# Patient Record
Sex: Female | Born: 2000 | Race: Black or African American | Hispanic: No | Marital: Single
Health system: Midwestern US, Academic
[De-identification: ages and names within clinical notes are randomized; demographics above are authoritative.]

---
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
## 2018-07-16 ENCOUNTER — Emergency Department (HOSPITAL_COMMUNITY): Payer: Self-pay

## 2018-07-16 ENCOUNTER — Encounter (HOSPITAL_COMMUNITY): Payer: Self-pay

## 2018-07-16 ENCOUNTER — Other Ambulatory Visit: Payer: Self-pay

## 2018-07-16 ENCOUNTER — Emergency Department (HOSPITAL_COMMUNITY)
Admission: EM | Admit: 2018-07-16 | Discharge: 2018-07-16 | Disposition: A | Discharge status: Home / Self Care | Payer: Self-pay | Attending: Emergency Medicine | Admitting: Emergency Medicine

## 2018-07-16 DIAGNOSIS — S93401A Sprain of unspecified ligament of right ankle, initial encounter: Secondary | ICD-10-CM | POA: Insufficient documentation

## 2018-07-16 DIAGNOSIS — Y92512 Supermarket, store or market as the place of occurrence of the external cause: Secondary | ICD-10-CM | POA: Insufficient documentation

## 2018-07-16 DIAGNOSIS — Y999 Unspecified external cause status: Secondary | ICD-10-CM | POA: Insufficient documentation

## 2018-07-16 DIAGNOSIS — W231XXA Caught, crushed, jammed, or pinched between stationary objects, initial encounter: Secondary | ICD-10-CM | POA: Insufficient documentation

## 2018-07-16 DIAGNOSIS — Z79899 Other long term (current) drug therapy: Secondary | ICD-10-CM | POA: Insufficient documentation

## 2018-07-16 DIAGNOSIS — Y9389 Activity, other specified: Secondary | ICD-10-CM | POA: Insufficient documentation

## 2018-07-16 NOTE — ED Triage Notes (Signed)
Patient reports that her right foot got caught between a grocery cart and a chair that she was sitting in today. Patient c/o pain right ankle

## 2018-07-16 NOTE — ED Notes (Signed)
Ortho tech called to patient room for ASO ankle placement and crutches. Patient reports she is 38ft 7in tall.

## 2018-07-16 NOTE — ED Provider Notes (Signed)
[facility]   CSN: [id number] Arrival date & time: 07/16/18  1652    History   Chief Complaint Chief Complaint  Patient presents with  . Ankle Injury    HPI Abigail Harper is a 18 y.o. female who is previously healthy who presents with right ankle pain after hitting it wedged between a grocery cart and an aisle.  She reports she was sitting at the blood pressure cuff at [facility] when she went to get up and her foot caught between a grocery cart.  She denies any foot pain.  She reports she initially did not have any pain, but walking on it, the pain became worse.  She had associated swelling.  She has been elevating it.  She denies any medication prior to arrival.  She denies any other injuries.  She denies any numbness or tingling.     HPI  History reviewed. No pertinent past medical history.  There are no active problems to display for this patient.   History reviewed. No pertinent surgical history.   OB History   No obstetric history on file.      Home Medications    Prior to Admission medications   Medication Sig Start Date End Date Taking? Authorizing Provider  naproxen sodium (ANAPROX) 220 MG tablet Take 2 tablets (440 mg total) by mouth 2 (two) times daily as needed (pain). 01/23/16   Fayrene Helper, PA-C  polyethylene glycol (MIRALAX / GLYCOLAX) packet Take 17 g by mouth daily. 01/23/16   Fayrene Helper, PA-C    Family History History reviewed. No pertinent family history.  Social History Social History   Tobacco Use  . Smoking status: Never Smoker  . Smokeless tobacco: Never Used  Substance Use Topics  . Alcohol use: No  . Drug use: No     Allergies   Patient has no known allergies.   Review of Systems Review of Systems  Musculoskeletal: Positive for arthralgias and joint swelling.  Neurological: Negative for numbness.     Physical Exam Updated Vital Signs BP (!) 135/91 (BP Location: Left Arm)   Pulse  72   Temp (!) 97.5 F (36.4 C) (Oral)   Resp 16   Ht 5\' 7"  (1.702 m)   Wt 88 kg   LMP 07/05/2018 (Approximate)   SpO2 98%   BMI 30.38 kg/m   Physical Exam Vitals signs and nursing note reviewed.  Constitutional:      General: She is not in acute distress.    Appearance: She is well-developed. She is not diaphoretic.  HENT:     Head: Normocephalic and atraumatic.     Mouth/Throat:     Pharynx: No oropharyngeal exudate.  Eyes:     General: No scleral icterus.       Right eye: No discharge.        Left eye: No discharge.     Conjunctiva/sclera: Conjunctivae normal.     Pupils: Pupils are equal, round, and reactive to light.  Neck:     Musculoskeletal: Normal range of motion and neck supple.     Thyroid: No thyromegaly.  Cardiovascular:     Rate and Rhythm: Normal rate and regular rhythm.     Heart sounds: Normal heart sounds. No murmur. No friction rub. No gallop.   Pulmonary:     Effort: Pulmonary effort is normal. No respiratory distress.     Breath sounds: Normal breath sounds. No stridor. No wheezing or rales.  Musculoskeletal:  Comments: Right sided lateral ankle tenderness and edema, no medial or lateral malleolar tenderness, Achilles intact, no deficit, no bony tenderness of the foot, DP pulse intact, sensation intact to the foot  Lymphadenopathy:     Cervical: No cervical adenopathy.  Skin:    General: Skin is warm and dry.     Coloration: Skin is not pale.     Findings: No rash.  Neurological:     Mental Status: She is alert.     Coordination: Coordination normal.      ED Treatments / Results  Labs (all labs ordered are listed, but only abnormal results are displayed) Labs Reviewed - No data to display  EKG None  Radiology Dg Ankle Complete Right  Result Date: 07/16/2018 CLINICAL DATA:  18 year old female status post blunt trauma to right foot and ankle by grocery cart today. Pain. EXAM: RIGHT ANKLE - COMPLETE 3+ VIEW COMPARISON:  None. FINDINGS:  Bone mineralization is within normal limits. There is no evidence of fracture, dislocation, or joint effusion. No acute osseous abnormality identified. No discrete soft tissue injury. IMPRESSION: No acute fracture or dislocation identified about the right ankle. Electronically Signed   By: Odessa Fleming M.D.   On: 07/16/2018 18:01    Procedures Procedures (including critical care time)  Medications Ordered in ED Medications - No data to display   Initial Impression / Assessment and Plan / ED Course  I have reviewed the triage vital signs and the nursing notes.  Pertinent labs & imaging results that were available during my care of the patient were reviewed by me and considered in my medical decision making (see chart for details).        Patient X-Ray negative for obvious fracture or dislocation. Home Care discussed including rest and elevate the injured ankle, apply ice intermittently. Use crutches without weight bearing until able to comfortable bear partial weight, then progress to full weight bearing as tolerated.  ASO splint applied. Crutches given. See ortho prn.  Patient understands and agrees with plan.  Patient vitals stable through ED course and discharged in satisfactory condition.    Final Clinical Impressions(s) / ED Diagnoses   Final diagnoses:  Sprain of right ankle, unspecified ligament, initial encounter    ED Discharge Orders    None       Emi Holes, PA-C 07/16/18 1830    Virgina Norfolk, DO 07/17/18 [phone]

## 2018-07-16 NOTE — Discharge Instructions (Addendum)
Medications: Ibuprofen, Tylenol ° °Treatment: Take ibuprofen every 8 hours to help with pain and swelling. You can alternate with Tylenol for breakthrough pain 4 hours after taking ibuprofen. Use ice 3-4 times daily alternating 20 minutes on, 20 minutes off. Keep your leg elevated whenever you're not walking on it. Wear your splint at all times except when bathing. Use crutches at all times initially and begin partial weightbearing as tolerated. ° °Follow-up: Please follow-up with the orthopedic doctor if your symptoms are not improving over the next 7-10 days. Please return to the emergency department if you develop any new or worsening symptoms. ° ° °

## 2019-03-19 ENCOUNTER — Other Ambulatory Visit: Payer: Self-pay

## 2019-03-19 ENCOUNTER — Encounter (HOSPITAL_COMMUNITY): Payer: Self-pay | Admitting: Emergency Medicine

## 2019-03-19 ENCOUNTER — Emergency Department (HOSPITAL_COMMUNITY)
Admission: EM | Admit: 2019-03-19 | Discharge: 2019-03-19 | Disposition: A | Discharge status: Home / Self Care | Payer: Self-pay | Attending: Emergency Medicine | Admitting: Emergency Medicine

## 2019-03-19 ENCOUNTER — Emergency Department (HOSPITAL_COMMUNITY): Payer: Self-pay

## 2019-03-19 DIAGNOSIS — Y990 Civilian activity done for income or pay: Secondary | ICD-10-CM | POA: Insufficient documentation

## 2019-03-19 DIAGNOSIS — X500XXA Overexertion from strenuous movement or load, initial encounter: Secondary | ICD-10-CM | POA: Insufficient documentation

## 2019-03-19 DIAGNOSIS — S93402A Sprain of unspecified ligament of left ankle, initial encounter: Secondary | ICD-10-CM | POA: Insufficient documentation

## 2019-03-19 DIAGNOSIS — Y9289 Other specified places as the place of occurrence of the external cause: Secondary | ICD-10-CM | POA: Insufficient documentation

## 2019-03-19 DIAGNOSIS — Y9389 Activity, other specified: Secondary | ICD-10-CM | POA: Insufficient documentation

## 2019-03-19 MED ORDER — IBUPROFEN 800 MG PO TABS: 800.0000 mg | ORAL_TABLET | Freq: Three times a day (TID) | ORAL | 0 refills | Status: AC | PRN

## 2019-03-19 NOTE — ED Provider Notes (Signed)
[facility] EMERGENCY DEPARTMENT Provider Note   CSN: [id number] Arrival date & time: 03/19/19  2355     History   Chief Complaint Chief Complaint  Patient presents with  . Ankle Pain    HPI Abigail Harper is a 18 y.o. female.     HPI Patient presents to the emergency department with a left ankle injury that occurred while at work yesterday.  The patient states that she was coming down off a ladder when she stepped wrong on one of the ladder steps and twisted her ankle.  She states when she got down off the ladder she felt like her ankle gave way as well.  Patient states that certain movements palpation make the pain worse.  Patient states she applied an ASO brace to her ankle.  Patient states she did not take any medications prior to arrival for her symptoms.  Patient denies any other injuries. History reviewed. No pertinent past medical history.  There are no active problems to display for this patient.   History reviewed. No pertinent surgical history.   OB History   No obstetric history on file.      Home Medications    Prior to Admission medications   Medication Sig Start Date End Date Taking? Authorizing Provider  naproxen sodium (ANAPROX) 220 MG tablet Take 2 tablets (440 mg total) by mouth 2 (two) times daily as needed (pain). 01/23/16   Fayrene Helper, PA-C  polyethylene glycol (MIRALAX / GLYCOLAX) packet Take 17 g by mouth daily. 01/23/16   Fayrene Helper, PA-C    Family History No family history on file.  Social History Social History   Tobacco Use  . Smoking status: Never Smoker  . Smokeless tobacco: Never Used  Substance Use Topics  . Alcohol use: No  . Drug use: No     Allergies   Patient has no known allergies.   Review of Systems Review of Systems All other systems negative except as documented in the HPI. All pertinent positives and negatives as reviewed in the HPI.  Physical Exam Updated Vital Signs BP 107/73 (BP Location:  Right Arm)   Pulse 95   Temp 98.3 F (36.8 C) (Oral)   Resp 16   LMP 03/12/2019   Physical Exam Vitals signs and nursing note reviewed.  Constitutional:      General: She is not in acute distress.    Appearance: She is well-developed.  HENT:     Head: Normocephalic and atraumatic.  Eyes:     Pupils: Pupils are equal, round, and reactive to light.  Pulmonary:     Effort: Pulmonary effort is normal.  Musculoskeletal:     Left ankle: She exhibits decreased range of motion. She exhibits no swelling, no ecchymosis, no deformity and normal pulse. Tenderness. Medial malleolus tenderness found. No proximal fibula tenderness found. Achilles tendon normal.  Skin:    General: Skin is warm and dry.  Neurological:     Mental Status: She is alert and oriented to person, place, and time.      ED Treatments / Results  Labs (all labs ordered are listed, but only abnormal results are displayed) Labs Reviewed - No data to display  EKG None  Radiology No results found.  Procedures Procedures (including critical care time)  Medications Ordered in ED Medications - No data to display   Initial Impression / Assessment and Plan / ED Course  I have reviewed the triage vital signs and the nursing notes.  Pertinent  labs & imaging results that were available during my care of the patient were reviewed by me and considered in my medical decision making (see chart for details).       Patient be treated for sprain of her right ankle.  Will give crutches to help take some of the pressure off the ankle.  She does have an ASO which I feel is appropriate for this injury.  Told to ice and elevate her ankle.  Patient agrees to the plan and all questions were answered.  Final Clinical Impressions(s) / ED Diagnoses   Final diagnoses:  None    ED Discharge Orders    None       Dalia Heading, PA-C 03/19/19 [place]    Noemi Chapel, MD 03/20/19 [phone]

## 2019-03-19 NOTE — Discharge Instructions (Addendum)
Return here as needed.  Your ankle x-rays did not show any broken bones.  Ice and elevate.  I would continue to wear the brace.  Use the crutches as needed.  Follow-up with the orthopedist as needed.

## 2019-03-19 NOTE — ED Notes (Signed)
Family at bedside. 

## 2019-03-19 NOTE — ED Notes (Signed)
Patient verbalizes understanding of discharge instructions. Opportunity for questioning and answers were provided. Armband removed by staff, pt discharged from ED.  

## 2019-03-19 NOTE — ED Triage Notes (Signed)
C/o L ankle pain.  States she twisted it when coming down 3-4 steps off a ladder at work yesterday morning.  ASO in place on arrival.

## 2019-03-24 ENCOUNTER — Other Ambulatory Visit: Payer: Self-pay

## 2019-03-24 DIAGNOSIS — Z20822 Contact with and (suspected) exposure to covid-19: Secondary | ICD-10-CM

## 2019-03-26 LAB — NOVEL CORONAVIRUS, NAA: SARS-CoV-2, NAA: NOT DETECTED

## 2021-07-11 IMAGING — CR DG ANKLE COMPLETE 3+V*L*
3 series · 3 of 3 positions shown · non-contrast
Comparison: None.

CLINICAL DATA: Anterior and medial left ankle pain following an
injury 2 days ago.

EXAM:
LEFT ANKLE COMPLETE - 3+ VIEW

[ankle ap]
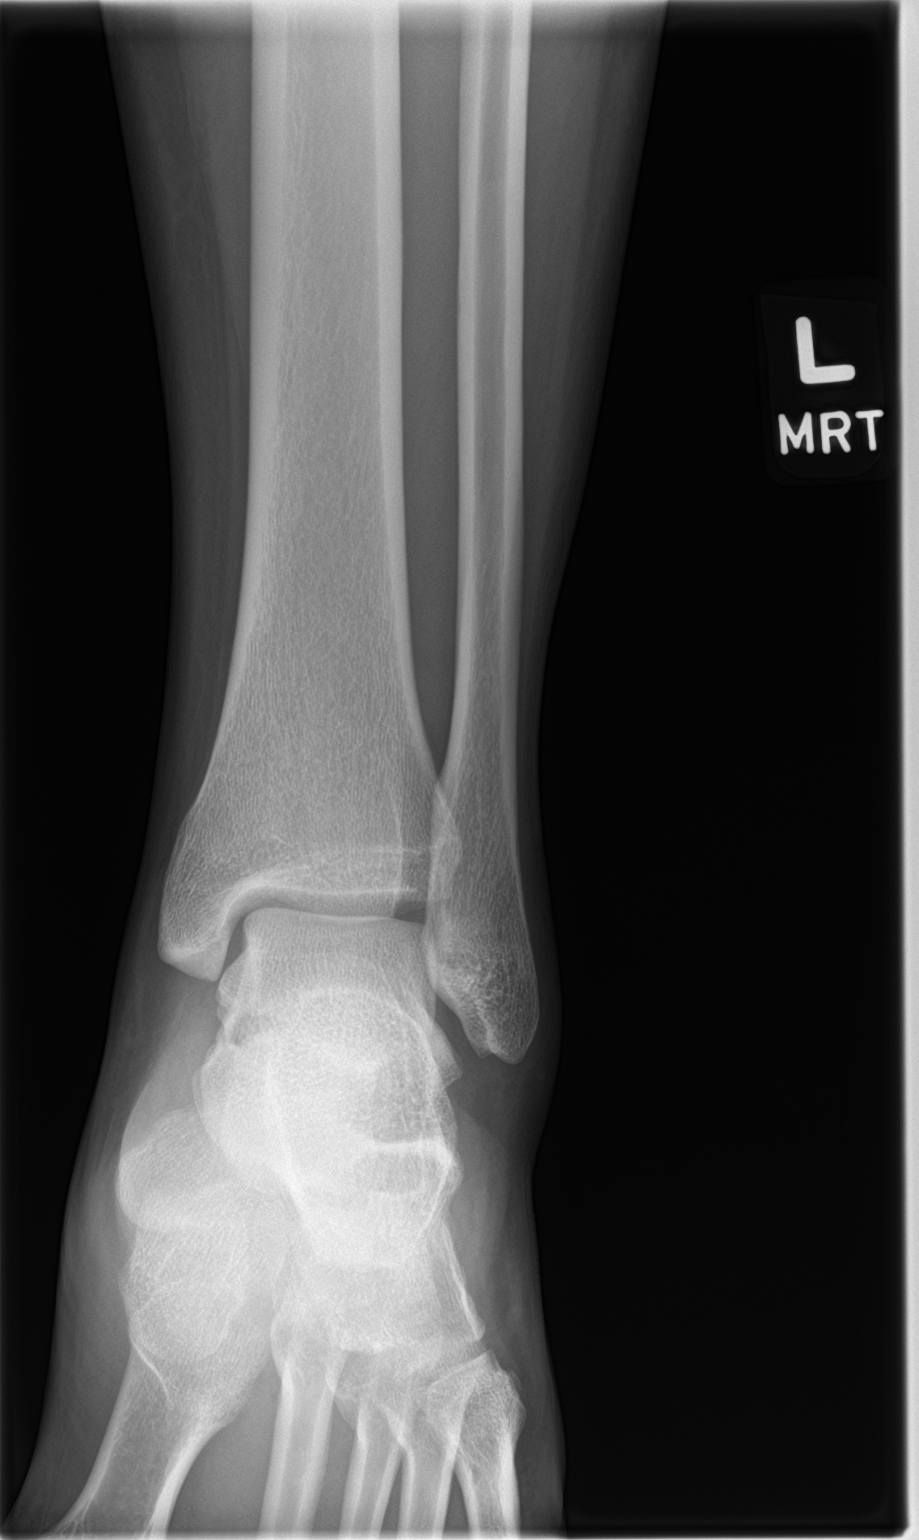

[ankle obl]
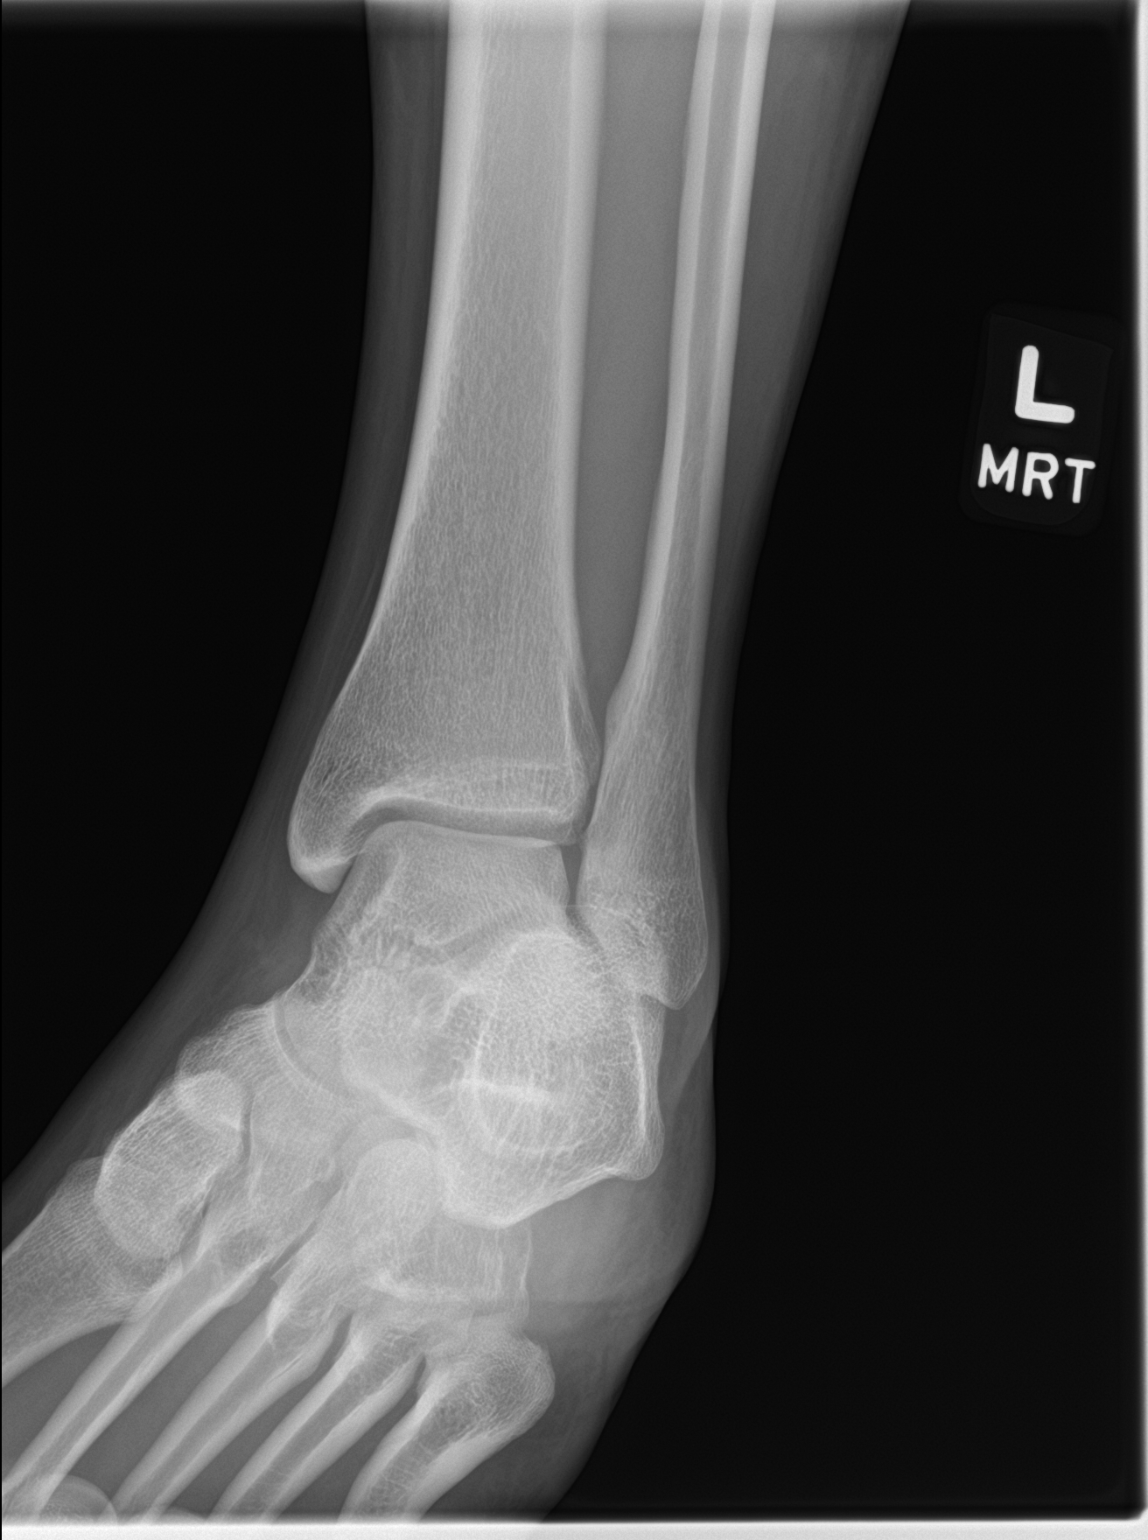

[ankle lat]
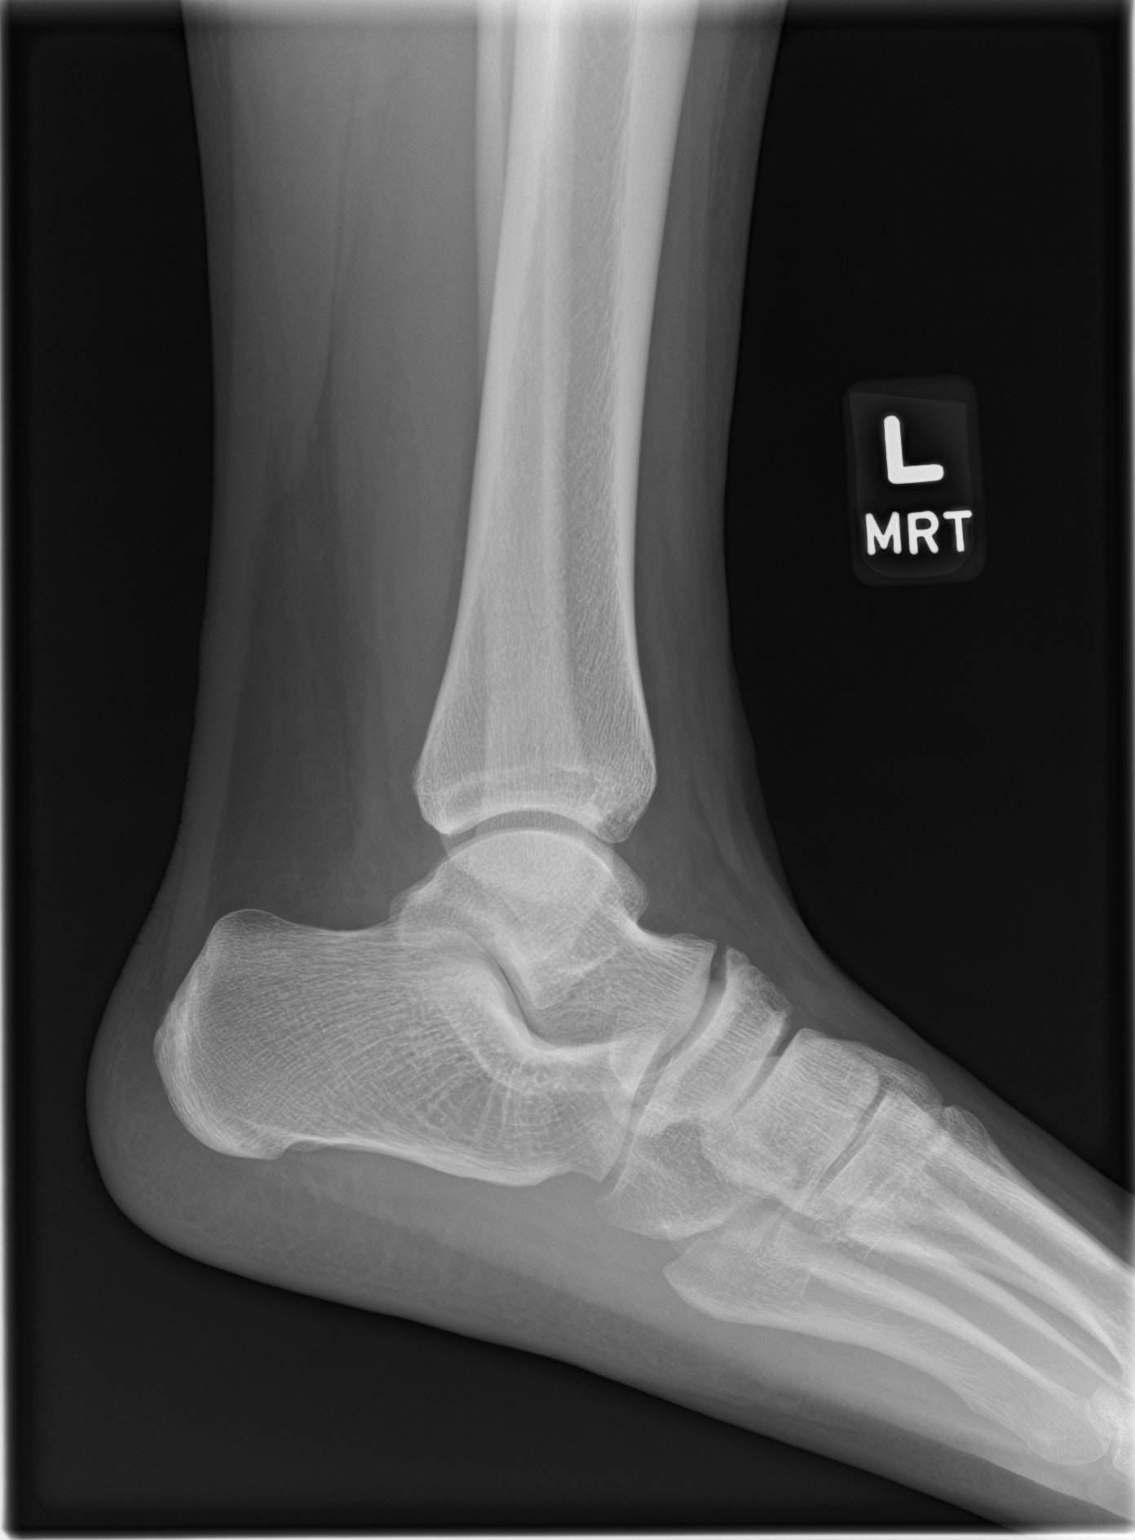

[3 of 3 positions shown; findings below may reference images not displayed]

FINDINGS: Mild soft tissue swelling. No fracture, dislocation or effusion
seen.
IMPRESSION: No fracture.

## 2022-02-14 ENCOUNTER — Inpatient Hospital Stay
Admit: 2022-02-14 | Discharge: 2022-02-14 | Disposition: A | Discharge status: Home / Self Care | Payer: PRIVATE HEALTH INSURANCE

## 2022-02-14 DIAGNOSIS — R519 Headache, unspecified: Secondary | ICD-10-CM

## 2022-02-14 MED ORDER — proCHLORPERazine (COMPAZINE) injection 10 mg
10 | Freq: Once | INTRAMUSCULAR | Status: AC
Start: 2022-02-14 — End: 2022-02-14
  Administered 2022-02-14: 16:00:00 10 mg via INTRAVENOUS

## 2022-02-14 MED ORDER — sodium chloride 0.9 % 1,000 mL IV fluid
Freq: Once | INTRAVENOUS | Status: AC
Start: 2022-02-14 — End: 2022-02-14
  Administered 2022-02-14: 16:00:00 1000 mL via INTRAVENOUS

## 2022-02-14 MED ORDER — ketorolac (TORADOL) injection 15 mg
15 | Freq: Once | INTRAMUSCULAR | Status: AC
Start: 2022-02-14 — End: 2022-02-14
  Administered 2022-02-14: 16:00:00 15 mg via INTRAVENOUS

## 2022-02-14 MED FILL — PROCHLORPERAZINE EDISYLATE 10 MG/2 ML (5 MG/ML) INJECTION SOLUTION: 10 10 mg/2 mL (5 mg/mL) | INTRAMUSCULAR | Qty: 2

## 2022-02-14 MED FILL — KETOROLAC 15 MG/ML INJECTION SOLUTION: 15 15 mg/mL | INTRAMUSCULAR | Qty: 1

## 2022-02-14 NOTE — Unmapped (Signed)
ED Attending Attestation Note    Date of service:  02/14/2022    This patient was seen by the resident physician.  I have seen and examined the patient, agree with the workup, evaluation, management and diagnosis. The care plan has been discussed and I concur.     My assessment reveals a 21 y.o. female with history of migraines who has a third shift job. SHe has had poor sleep hygiene and use multiple energy drinks over the course of the last 3 days. Now with recurrent migraine, left sided with visual scotoma, and insomnia. One episode of emesis, but now eating normally. Nonfocal neurologic exam, no visual complaints at this time.  Pain has moderated and scotoma resolved.

## 2022-02-14 NOTE — Unmapped (Signed)
Was seen today due to a headache. Please be sure to stay well-hydrated over the next couple of days. Make sure that you get an adequate amount of sleep before work. Try to avoid using caffeine at least 6 hours before you are planning to sleep.

## 2022-02-14 NOTE — Unmapped (Signed)
Visitor Print production planner (VSA) Customer service manager (SW) that the patient has been discharged and needs a ride home.  SW located the patient in the lobby.  The patient verbalized her plan to pay for an [employer] in lieu of waiting for an insurance cab.  The patient does not have any SW needs at this time.    Jerilynn Birkenhead MSW, [facility] Social Worker  [phone]

## 2022-02-14 NOTE — Unmapped (Signed)
Patient presents to [facility] with c/o migraine and sleep issues after drinking multiple energy drinks and working night shift. Pt is alert and oriented at this time, no acute distress noted.

## 2022-02-14 NOTE — Unmapped (Signed)
[facility] ED Note    Date of Service: 02/14/2022  Reason for Visit: Headache      ED Course and MDM     Regana Kemple is a 21 y.o. female with a history and presentation as described below in HPI.  The patient was evaluated by myself and the ED Attending Physician. All management and disposition plans were discussed and agreed upon.    ED Course as of 02/14/22 1208   Fri Feb 14, 2022   1050 Patient is a 21 y.o. female who presents for headaches. Upon presentation, patient was afebrile, not tachycardic, satting well on RA. On examination, patient was in no acute distress, well appearing, breathing spontaneously. Her neuro examination was nonfocal, patient did not have any nystagmus, no neck pain and had full range of motion, denies any visual changes. Low concern for CVST, SAH, or other intracranial pathology given reassuring examination. Low concern for infectious cause, patient is afebrile without other systemic signs or symptoms. More likely headache due to her heavy caffeine use as well as decreased sleep. We will give patient fluids, Toradol and Compazine for symptomatic treatment. We will also get EKG due to her heavy caffeine use to rule out any signs of dysrhythmia.     1127 EKG NSR without signs of acute ischemia, no dysrhythmia.   1207 Upon reassessment, patient reports symptomatic improvement. Overall reassuring examination. Patient will be discharged home, advised to continue with good fluid intake and adequate sleep before work.       Medical Decision Making  Problems Addressed:  Nonintractable headache, unspecified chronicity pattern, unspecified headache type: complicated acute illness or injury    Amount and/or Complexity of Data Reviewed  Labs: ordered.  ECG/medicine tests: ordered.    Risk  Prescription drug management.         Medications received during this ED visit:  Medications   sodium chloride 0.9 % 1,000 mL IV fluid (1,000 mLs  Intravenous New Bag 02/14/22 1133)   ketorolac (TORADOL) injection 15 mg (15 mg Intravenous Given 02/14/22 1134)   proCHLORPERazine (COMPAZINE) injection 10 mg (10 mg Intravenous Given 02/14/22 1134)       Discharge - At this time, the patient was deemed appropriate for discharge. My customary discharge instructions, including strict return precautions for new or worsening symptoms concerning to the patient, were provided. All of the patient's questions were answered satisfactorily, and they were subsequently sent home in stable condition.     Impression     1. Nonintractable headache, unspecified chronicity pattern, unspecified headache type         Plan     Discharge  1. The patient is to be discharged home in stable/improved condition.  2. Workup, treatment and diagnosis were discussed with the patient and/or family members; the patient agrees to the plan and all questions were addressed and answered.  3. The patient is instructed to return to the emergency department should her symptoms worsen or any concern she believes warrants acute physician evaluation.    HPI     Ladeana Chrisann Melaragno is a 21 y.o. female who presents with headache. Patient reports that she is presenting with a migraine after she had multiple energy drinks and worked an overnight shift. Patient reports that Wednesday overnight, she had a 5-hour energy drink right before she had gotten off work. Patient was unable to sleep throughout the rest of the day due to the caffeine. Patient reports that she finally fell asleep around 8 PM, but  had to wake up at 10 PM for work the next day. Had trouble staying up, therefore drink some red bull during the shift. During the shift, started feeling a headache come on that was gradual. Reports that it is located all over, but mostly behind the left eye. Reports photophobia. Denies any photosensitivity. Has never really had migraines in the past. Denies any history of clots or bleeding disorders. No falls or head  trauma. Denies any nausea or vomiting. Denies any medical history. Not the worst headache that she has ever had. Denies any chest pain, shortness of breath, abdominal pain, dysuria.    Other than stated above, no additional associated symptoms or aggravating or alleviating factors are noted.    History     History reviewed. No pertinent past medical history.    History reviewed. No pertinent surgical history.    Patient  reports that she has been smoking cigarettes. She uses smokeless tobacco. She reports current alcohol use. She reports current drug use. Drug: Marijuana.    Previous Medications    No medications on file       Allergies as of 02/14/2022    (No Known Allergies)       All nursing notes and triage notes were appropriately reviewed in the course of the creation of this note.     Review of Systems     ROS as stated above, all other systems reviewed and are negative.    Physical Exam     Vitals:    02/14/22 1036   BP: 122/77   BP Location: Left upper arm   Patient Position: Sitting   Pulse: 72   Resp: 17   Temp: 98.2 F (36.8 C)   TempSrc: Oral   SpO2: 98%       Physical Exam  Constitutional:       Appearance: Normal appearance.   HENT:      Head: Normocephalic and atraumatic.      Nose: Nose normal.      Mouth/Throat:      Pharynx: Oropharynx is clear.   Eyes:      Pupils: Pupils are equal, round, and reactive to light.   Cardiovascular:      Rate and Rhythm: Normal rate and regular rhythm.      Pulses: Normal pulses.      Heart sounds: Normal heart sounds.   Pulmonary:      Effort: Pulmonary effort is normal.      Breath sounds: Normal breath sounds.   Abdominal:      Palpations: Abdomen is soft.      Tenderness: There is no abdominal tenderness.   Musculoskeletal:         General: Normal range of motion.      Cervical back: Neck supple.   Skin:     General: Skin is warm.      Capillary Refill: Capillary refill takes less than 2 seconds.   Neurological:      General: No focal deficit present.       Mental Status: She is alert.       Diagnostic Studies     Labs:  Labs Reviewed   ED ECG 12-LEAD (MUSE)    Narrative:     Ventricular Rate:  76  BPM  Atrial Rate:  76  BPM  P-R Interval:  130  ms  QRS Duration:  80  ms  QT:  378  ms  QTc:  425  ms  P Axis:  42  degrees  R Axis:  33  degrees  T Axis:  52  degrees  Diagnosis Line:  NORMAL SINUS RHYTHM WITH SINUS ARRHYTHMIA ^ NORMAL ECG       Radiology:  No orders to display       Procedures     Procedures  ______________________________________________________________________________  Duanne Moron, MD  PGY-2  [facility]     This note was dictated using voice-recognition software, which occasionally leads to inadvertent typographic errors.       Duanne Moron, MD  Resident  02/14/22 [phone]

## 2022-04-21 ENCOUNTER — Inpatient Hospital Stay
Admit: 2022-04-21 | Discharge: 2022-04-22 | Disposition: A | Discharge status: Home / Self Care | Payer: PRIVATE HEALTH INSURANCE

## 2022-04-21 DIAGNOSIS — S39012A Strain of muscle, fascia and tendon of lower back, initial encounter: Secondary | ICD-10-CM

## 2022-04-21 MED ORDER — methocarbamoL (ROBAXIN) tablet 750 mg
500 | Freq: Once | ORAL | Status: AC
Start: 2022-04-21 — End: 2022-04-21
  Administered 2022-04-22: 01:00:00 750 mg via ORAL

## 2022-04-21 MED ORDER — ibuprofen (MOTRIN) tablet 800 mg
800 | Freq: Once | ORAL | Status: AC
Start: 2022-04-21 — End: 2022-04-21
  Administered 2022-04-22: 01:00:00 800 mg via ORAL

## 2022-04-21 MED ORDER — oxyCODONE (ROXICODONE) immediate release tablet 5 mg
5 | Freq: Once | ORAL | Status: AC
Start: 2022-04-21 — End: 2022-04-21
  Administered 2022-04-22: 01:00:00 5 mg via ORAL

## 2022-04-21 MED ORDER — lidocaine (LIDODERM) 5 % 1 patch
5 | Freq: Once | TOPICAL
Start: 2022-04-21 — End: 2022-04-22
  Administered 2022-04-22: 01:00:00 1 via TRANSDERMAL

## 2022-04-21 NOTE — Unmapped (Cosign Needed)
Pt arrived into the ED with lower back pain , patient rated pain was  9 out of 10. Patient stated the pain started a month ago. Patient stated the pain happened when she lifted a heavy box at work.

## 2022-04-21 NOTE — Unmapped (Signed)
AVS reviewed with patient. Patient demonstrates understanding of discharge education. PIV removed per protocol. Patient ambulatory and able to exit ER under own power.

## 2022-04-21 NOTE — Unmapped (Signed)
ED Attending Attestation Note    Date of service:  04/21/2022    This patient was seen by the resident physician.  I have seen and examined the patient, agree with the workup, evaluation, management and diagnosis. The care plan has been discussed and I concur.     My assessment reveals a 21 y.o. female presenting with back pain..    On exam, patient has 5/5 strength in bilateral lower extremities.  She is ambulatory with a normal gait.  She has no bony tenderness to palpation.  She has a normal rate and regular rhythm.  She is easy work of breathing with equal chest rise bilaterally.

## 2022-04-21 NOTE — Unmapped (Signed)
[facility] ED Note    Date of Service: 04/21/2022  Reason for Visit: Back Pain      Patient History     HPI  Linda Weaver is a 21 y.o. female who presents emergency department for back pain.  Patient states back pain began 1 month ago when she was lifting a box at work, states that she strained her back then.  Patient states that she has seen doctors for this, has been prescribed Robaxin, and ibuprofen without relief of her symptoms.  Patient states that today her pain continues to be a bother, and is now rating to her right hip.  She denies any trauma to the right hip.  She further denies any saddle anesthesia, urinary retention and lower extremity weakness.      History reviewed. No pertinent past medical history.  History reviewed. No pertinent surgical history.      Physical Exam     Vitals:    04/21/22 1836 04/21/22 1914   BP: 119/70 128/75   BP Location: Left upper arm Right upper arm   Patient Position: Sitting Lying   BP Cuff Size:  Regular   Pulse: 92 83   Resp: 18 16   Temp: 97.4 F (36.3 C)    TempSrc: Oral    SpO2: 99% 100%     General: No.,  Age-appropriate female, lying comfortably in the chair no acute distress.   Pulmonary:   Non-labored breathing  Cardiac:  Regular rate   Abdomen: Non-distended  Musculoskeletal:  No long bone deformity. No peripheral edema.  Tenderness to palpation of the bilateral lower paralumbar regions.  Skin:  Dry, no rashes  Neuro:  Alert. Moves all four extremities to command.  5/5 strength in bilateral lower extremities.    Diagnostic Studies     Labs:  Please see EMR for labs obtained during this patient encounter.    Radiology:  Please see EMR for imaging obtained during this patient encounter.      ED Course and MDM     Linda Weaver is a 21 y.o. female with a history and presentation as described above in HPI.  The patient was evaluated by myself and the ED Attending Physician, Dr. Carey Bullocks. All management and disposition plans were discussed and agreed  upon.    Upon presentation, the patient was afebrile, well-appearing, with unremarkable vital signs.  Patient presenting the emergency department for ongoing back pain not improved for 1 month.  Patient's pain and history consistent with musculoskeletal pain.  She did not have any saddle anesthesia, urinary retention, or lower extremity weakness And concerns for cauda equina syndrome.  She further denies any pain down her legs, low suspicion for any neuropathic etiology.  Patient was given Lidoderm, Robaxin, ibuprofen, and oxycodone for pain.  She will be referred to our community health care workers to set up care with a primary care physician.      Medical Decision Making  Problems Addressed:  Lumbar strain, initial encounter: complicated acute illness or injury    Risk  Prescription drug management.           Impression     1. Lumbar strain, initial encounter         Plan       Discharge, see DCI provided      Stacie Acres, MD, PGY-4  [facility]         Stacie Acres, MD  Resident  04/21/22 [phone]

## 2022-04-22 MED FILL — IBUPROFEN 800 MG TABLET: 800 800 MG | ORAL | Qty: 1

## 2022-04-22 MED FILL — OXYCODONE 5 MG TABLET: 5 5 MG | ORAL | Qty: 1

## 2022-04-22 MED FILL — METHOCARBAMOL 500 MG TABLET: 500 500 MG | ORAL | Qty: 2

## 2022-04-22 MED FILL — LIDODERM 5 % TOPICAL PATCH: 5 5 % | TOPICAL | Qty: 1

## 2022-04-22 NOTE — Unmapped (Signed)
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   Plan: Will attempt to follow-up with patient same day or upon return to the [facility].     Domenick Gong, BSW  [facility] Worker  [facility]  [address]  [place], [place] [address]  P: [phone]  E: [email or web address]

## 2022-11-25 ENCOUNTER — Emergency Department: Admit: 2022-11-25 | Payer: PRIVATE HEALTH INSURANCE

## 2022-11-25 ENCOUNTER — Inpatient Hospital Stay
Admit: 2022-11-25 | Discharge: 2022-11-26 | Disposition: A | Discharge status: Home / Self Care | Payer: PRIVATE HEALTH INSURANCE

## 2022-11-25 DIAGNOSIS — M79642 Pain in left hand: Secondary | ICD-10-CM

## 2022-11-25 DIAGNOSIS — M79641 Pain in right hand: Secondary | ICD-10-CM

## 2022-11-25 MED ORDER — acetaminophen (TYLENOL EXTRA STRENGTH) 500 MG tablet
500 | ORAL_TABLET | Freq: Four times a day (QID) | ORAL | 0 refills | 8.00000 days | Status: AC | PRN
Start: 2022-11-25 — End: ?

## 2022-11-25 MED ORDER — naproxen (NAPROSYN) 500 MG tablet
500 | ORAL_TABLET | Freq: Two times a day (BID) | ORAL | 0 refills | Status: AC | PRN
Start: 2022-11-25 — End: ?

## 2022-11-25 NOTE — ED Notes (Signed)
Per MD order, pt to be DC to home.      AVS completed and given to patient.  RN went over DC instructions regarding follow-up care and medications.  Pt verbalized understanding and signed one copy to be placed in paper chart.      RN assisted pt in gathering personal belongings.      CMU notified that pt DC from monitor; monitor removed and placed in appropriate bin.      Transport called for patient.  Pt left unit in good condition with all personal belongings.  Pt headed to main entrance via self

## 2022-11-25 NOTE — ED Provider Notes (Signed)
[facility] ED  Reassessment Note    Linda Weaver is a 22 y.o. female who presented to the emergency department on 11/25/2022. This patient was initially seen by an off-going provider and their care has been turned over to me. Please see the original provider's note for details regarding the initial history, physical exam and ED course.  At the time of turnover the following steps in the patient's evaluation were pending:     - hand xray     Clinical Impression:    1.  Hand x-ray unremarkable for acute fractures.  The patient was prescribed naproxen and Tylenol to the pharmacy.  Sports medicine referral given for worsening symptoms.  I also counseled the patient and obtain a thumb splint/brace to provide additional support to that region.    Discharge:  The patient is to be discharged home in stable/improved condition. Workup, treatment and diagnosis were discussed with the patient and/or family members; the patient agrees to the plan and all questions were addressed and answered. The patient is instructed to return to the emergency department should her symptoms worsen or any concern she believes warrants acute physician evaluation.    Jaclyn Shaggy, MD  8:14 PM  11/25/2022

## 2022-11-25 NOTE — ED Provider Notes (Signed)
[facility] ED Note    Date of Service: 11/25/2022  Reason for Visit: Hand Pain      Patient History     HPI  Linda Weaver is a 22 y.o. R handed female  who presents to the ED for evaluation of R hand pain that began that began about a month ago and has since worsened. She reports the pain initially began when she lifted a 45 lb box at work. 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  Pain is worse with flexion and extension of the thumb.  She endorses some tingling in the hand when she wears medical tape for compression. Denies history of hand pain or issues, numbness, weakness. She reports attempting 500 mg Tylenol every now and then for the pain with minimal relief.        No past medical history on file.  No past surgical history on file.    Physical Exam     Vitals:    11/25/22 1702   BP: 128/81   BP Location: Right upper arm   Patient Position: Sitting   Pulse: 84   Resp: 18   Temp: 98.8 F (37.1 C)   TempSrc: Oral   SpO2: 95%       General:  Well appearing. No acute distress, resting comfortably.  Eyes:  Pupils reactive. EOMI.   HENT: NCAT. Tolerating oral secretions  Pulmonary:   Non-labored breathing. Breath sounds clear bilaterally.  Cardiac:  Regular rate and rhythm.   Abdomen:  Soft. Non-distended. Non-tender.   Musculoskeletal:  No long bone deformity. No peripheral edema.  Tenderness to palpation at the thenar eminence of the right hand.  Pain is exacerbated by thumb abduction and adduction.  No tenderness in the digits, wrist, or the remainder of the right hand.  No swelling, erythema, wounds, or deformity.  Strong radial pulses bilaterally.  Sensation intact bilaterally throughout the radian, ulnar, and median nerve distributions.  Symptoms are not reproduced with Tinel's or Phalen test.  Skin:  Dry, no rashes  Neuro:  Alert and oriented x4. CN II-XII intact. Moves all four extremities to command.     Diagnostic Studies      Labs:  Please see EMR for labs obtained during this patient encounter     Radiology:  Please see EMR for images obtained during this patient encounter     EKG Interpretation:  None performed.     ED Course and MDM     Linda Weaver is a 22 y.o. female with a history and presentation as described above in HPI.  The patient was evaluated by myself and the ED Attending Physician,Dr. Tomasa Blase . All management and disposition plans were discussed and agreed upon.     On arrival VSS, pt is nontoxic appearing. Suspect overuse injury. Differential includes de Quervain's tenosynovitis, ligament injury, subacute fracture.  There is no erythema, warmth, or swelling overlying the joint that would be concerning for septic arthritis.  No evidence of neurovascular compromise on exam.  Will obtain x-ray of the right hand to evaluate for any bony abnormalities.  The patient was at this time signed out to the oncoming provider pending x-ray results and disposition.      Medications received during this ED visit:  Medications - No data to display         Medical Decision Making  Problems Addressed:  Left hand pain: complicated  acute illness or injury    Amount and/or Complexity of Data Reviewed  Radiology: ordered.    Risk  OTC drugs.  Prescription drug management.         Impression     1. Left hand pain      Plan   Sign out:  The patient is to be signed out to the oncoming provider for further care, please refer to reassessment note for updates. My colleague's responsibilities will include: following up on any pending blood work, imaging results, consults, and/or procedures and determining the patient's disposition. Workup, treatment and diagnosis were discussed with the patient and/or family members; the patient agrees to the plan and all questions were addressed and answered.      Adline Peals, MD  [facility], PGY-2     Adline Peals, MD  Resident  11/25/22 [phone]

## 2022-11-25 NOTE — Discharge Instructions (Addendum)
You were seen in the ED for hand pain. You have been prescribed naproxen and tylenol for your pain. Please take them as prescribed. You have been given a referral to see sports medicine in the event that your symptoms do not improve.  Please purchase the thumb splint/brace that we discussed in the ED and wear when you are not in bed.  Your hand x-ray did not show any acute fractures.     For Orthopedic Surgery appointments, please call [phone]

## 2022-11-25 NOTE — ED Provider Notes (Signed)
ED Attending Attestation Note    Date of service:  11/25/2022    This patient was seen by the resident physician.  I have seen and examined the patient, agree with the workup, evaluation, management and diagnosis. The care plan has been discussed and I concur.     My assessment reveals a 22 y.o. female right thumb pain.  Patient is right-hand dominant.  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  She states that she normally works about 4 days a week but is recently working 6 days a week and with this increase in activity she is began having right thumb pain.  She reports pain over the thenar eminence which is worse with gripping and movement of the right thumb.    Exam: Well-nourished, well-developed, no acute distress, nontoxic appearance, regular rate and rhythm, palpable pulses x 4 extremities including radial pulse in the bilateral upper extremities, normal respirations. RUE: Nontender shoulder/upper arm/elbow/forearm/wrist, nontender 2nd-5th digits, tenderness over the thenar eminence and palmar surface of the thumb, no tenderness over the dorsal aspect of the thumb or hand, no pain with extension against resistance, does have pain with opposition and flexion of the thumb, sensation intact throughout the radial, ulnar, and median nerve distributions of the right hand.

## 2022-11-25 NOTE — ED Triage Notes (Signed)
Patient to ED for right wrist/right thumb pain. Patient reports she works for [employer] and they have been trying different things to help, but pain is getting worse. Patient does not have PCP. Patient describes pain as sharp pain when she moves it a certain way.
